# Patient Record
Sex: Female | Born: 2005 | Race: Black or African American | Hispanic: No | Marital: Single | Smoking: Never smoker
Health system: Southern US, Community
[De-identification: ages and names within clinical notes are randomized; demographics above are authoritative.]

## PROBLEM LIST (undated history)

## (undated) DIAGNOSIS — E669 Obesity, unspecified: Secondary | ICD-10-CM

## (undated) DIAGNOSIS — Z8489 Family history of other specified conditions: Secondary | ICD-10-CM

---
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
## 2024-01-08 ENCOUNTER — Emergency Department (HOSPITAL_COMMUNITY)

## 2024-01-08 ENCOUNTER — Emergency Department (HOSPITAL_COMMUNITY)
Admission: EM | Admit: 2024-01-08 | Discharge: 2024-01-08 | Discharge status: Against Medical Advice | Attending: Emergency Medicine | Admitting: Emergency Medicine

## 2024-01-08 ENCOUNTER — Encounter (HOSPITAL_COMMUNITY): Payer: Self-pay

## 2024-01-08 ENCOUNTER — Other Ambulatory Visit: Payer: Self-pay

## 2024-01-08 DIAGNOSIS — Z5321 Procedure and treatment not carried out due to patient leaving prior to being seen by health care provider: Secondary | ICD-10-CM | POA: Insufficient documentation

## 2024-01-08 DIAGNOSIS — R1031 Right lower quadrant pain: Secondary | ICD-10-CM | POA: Diagnosis present

## 2024-01-08 DIAGNOSIS — R197 Diarrhea, unspecified: Secondary | ICD-10-CM | POA: Diagnosis not present

## 2024-01-08 LAB — COMPREHENSIVE METABOLIC PANEL WITH GFR
ALT: 14 U/L (ref 0–44)
AST: 14 U/L — ABNORMAL LOW (ref 15–41)
Albumin: 3.6 g/dL (ref 3.5–5.0)
Alkaline Phosphatase: 60 U/L (ref 38–126)
Anion gap: 10 (ref 5–15)
BUN: <5 mg/dL — ABNORMAL LOW (ref 6–20)
CO2: 24 mmol/L (ref 22–32)
Calcium: 9.3 mg/dL (ref 8.9–10.3)
Chloride: 101 mmol/L (ref 98–111)
Creatinine, Ser: 0.75 mg/dL (ref 0.44–1.00)
GFR, Estimated: >60 mL/min (ref >60)
Glucose, Bld: 88 mg/dL (ref 70–99)
Potassium: 4 mmol/L (ref 3.5–5.1)
Sodium: 135 mmol/L (ref 135–145)
Total Bilirubin: 0.4 mg/dL (ref 0.0–1.2)
Total Protein: 8.1 g/dL (ref 6.5–8.1)

## 2024-01-08 LAB — URINALYSIS, ROUTINE W REFLEX MICROSCOPIC
Bilirubin Urine: NEGATIVE
Glucose, UA: NEGATIVE mg/dL
Hgb urine dipstick: NEGATIVE
Ketones, ur: 5 mg/dL — AB
Leukocytes,Ua: NEGATIVE
Nitrite: NEGATIVE
Protein, ur: 100 mg/dL — AB
Specific Gravity, Urine: 1.032 — ABNORMAL HIGH (ref 1.005–1.030)
pH: 6 (ref 5.0–8.0)

## 2024-01-08 LAB — CBC WITH DIFFERENTIAL/PLATELET
Abs Immature Granulocytes: 0.01 10*3/uL (ref 0.00–0.07)
Basophils Absolute: 0 10*3/uL (ref 0.0–0.1)
Basophils Relative: 0 %
Eosinophils Absolute: 0.2 10*3/uL (ref 0.0–0.5)
Eosinophils Relative: 2 %
HCT: 38.6 % (ref 36.0–46.0)
Hemoglobin: 12.3 g/dL (ref 12.0–15.0)
Immature Granulocytes: 0 %
Lymphocytes Relative: 44 %
Lymphs Abs: 3.8 10*3/uL (ref 0.7–4.0)
MCH: 26.7 pg (ref 26.0–34.0)
MCHC: 31.9 g/dL (ref 30.0–36.0)
MCV: 83.9 fL (ref 80.0–100.0)
Monocytes Absolute: 0.6 10*3/uL (ref 0.1–1.0)
Monocytes Relative: 7 %
Neutro Abs: 4 10*3/uL (ref 1.7–7.7)
Neutrophils Relative %: 47 %
Platelets: 449 10*3/uL — ABNORMAL HIGH (ref 150–400)
RBC: 4.6 MIL/uL (ref 3.87–5.11)
RDW: 14.3 % (ref 11.5–15.5)
WBC: 8.6 10*3/uL (ref 4.0–10.5)
nRBC: 0 % (ref 0.0–0.2)

## 2024-01-08 LAB — LIPASE, BLOOD: Lipase: 21 U/L (ref 11–51)

## 2024-01-08 LAB — HCG, SERUM, QUALITATIVE: Preg, Serum: NEGATIVE

## 2024-01-08 MED ORDER — IOHEXOL 350 MG/ML SOLN
75.0000 mL | Freq: Once | INTRAVENOUS | Status: AC | PRN
  Administered 2024-01-08: 75 mL via INTRAVENOUS

## 2024-01-08 NOTE — ED Provider Triage Note (Signed)
 Emergency Medicine Provider Triage Evaluation Note  Margaret Ware , a 18 y.o. female  was evaluated in triage.  Pt complains of right lower quadrant pain for the past week.  Mom reports that she has a history of constipation and she has had 2 bottles of magnesium citrate over the past few days and is not having diarrhea.  Mom reports that she is prone to constipation.  She reports that she has been having some vomiting and nausea but no trouble with urination or pain with urination.  No fevers.  She reports that she does have a history of ovarian cyst.  Patient reports that she does not feel any better after having bowel movements.  Review of Systems  Positive:  Negative:   Physical Exam  BP 125/84   Pulse 79   Temp 98.6 F (37 C)   Resp 16   SpO2 100%  Gen:   Awake, no distress   Resp:  Normal effort  MSK:   Moves extremities without difficulty  Other:  Patient on phone in no acute distress.  Abdomen is soft and nontender to palpation.  Medical Decision Making  Medically screening exam initiated at 7:14 PM.  Appropriate orders placed.  Bettyjane Shanikqua Zarzycki was informed that the remainder of the evaluation will be completed by another provider, this initial triage assessment does not replace that evaluation, and the importance of remaining in the ED until their evaluation is complete.  Abdomen is soft without any tenderness palpation however mom's concern for ovarian cyst or some other pathology.  Discussed that we can order CT scan with labs.   Spence Dux, [place] 01/08/24 [id number]

## 2024-01-08 NOTE — ED Triage Notes (Signed)
 Pt reports 3 days of RLQ abd pain after eating. Pt not tender to palpation. Pt also endorses diarrhea after drinking 2 bottles of mag citrate.

## 2024-01-08 NOTE — ED Notes (Signed)
 Pt decided to leave. Suggested them stay and be seen. She was adamant about leaving. [name] she would go to urgent care tomorrow.

## 2024-01-09 ENCOUNTER — Other Ambulatory Visit: Payer: Self-pay

## 2024-01-09 ENCOUNTER — Ambulatory Visit
Admission: EM | Admit: 2024-01-09 | Discharge: 2024-01-09 | Disposition: A | Discharge status: Home / Self Care | Payer: Self-pay | Attending: Family Medicine | Admitting: Family Medicine

## 2024-01-09 VITALS — BP 124/85 | HR 75 | Temp 98.0°F | Resp 16

## 2024-01-09 DIAGNOSIS — A084 Viral intestinal infection, unspecified: Secondary | ICD-10-CM | POA: Diagnosis not present

## 2024-01-09 DIAGNOSIS — N83201 Unspecified ovarian cyst, right side: Secondary | ICD-10-CM | POA: Diagnosis not present

## 2024-01-09 MED ORDER — ONDANSETRON 4 MG PO TBDP: 4.0000 mg | ORAL_TABLET | Freq: Three times a day (TID) | ORAL | 0 refills | Status: AC | PRN

## 2024-01-09 NOTE — ED Triage Notes (Addendum)
 Pt c/o RLQ abdominal pain, N/Dx3d. Pt denies vomiting. Pt states has vomited twice

## 2024-01-09 NOTE — ED Provider Notes (Signed)
 [facility]    CSN: [id number] Arrival date & time: 01/09/24  0857      History   Chief Complaint Chief Complaint  Patient presents with   Abdominal Pain    Vomiting and diarrhea - Entered by patient    HPI Margaret Ware is a 18 y.o. female presents for nausea vomiting and abdominal pain.  Patient reports 3 days of a persistent right lower quadrant nonradiating abdominal pain.  She states she has been having nonbloody nonbilious vomiting as well as diarrhea.  Denies any fevers, bloating, vaginal discharge, dysuria, body aches, URI symptoms.  No recent travel.  Denies any history of GI diagnoses such as Crohn's, IBS, colitis, diverticulitis.  Does have a history of ovarian cyst with surgical removal otherwise no other history of abdominal surgeries.  She states symptoms improved with heating pad and cannot identify any aggravating factors.  Does have a decreased appetite but has been able to stay hydrated.  Patient did go to the emergency room last night around 6 PM for same complaint.  She had blood work, UA that was negative for UTI, as well as a CT abdomen and pelvis showing multiple complex cystic lesions involving both adnexa suspicious for multiple dermoids.  No other focal abnormality noted.  Patient did leave with about being seen.  She has not taken any OTC treatments for symptoms.  No other concerns at this time.   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      Home Medications    Prior to Admission medications   Medication Sig Start Date End Date Taking? Authorizing Provider  ondansetron  (ZOFRAN -ODT) 4 MG disintegrating tablet Take 1 tablet (4 mg total) by mouth every 8 (eight) hours as needed for nausea or vomiting. 01/09/24  Yes Jaquon Gingerich, Jodi R, NP  albuterol  (VENTOLIN  HFA) 108 (90 Base) MCG/ACT inhaler Inhale 1-2 puffs into the lungs every 6 (six) hours as needed for wheezing or shortness of breath. 09/14/20   Corbin Dess, PA-C  amoxicillin  (AMOXIL ) 875 MG tablet Take 1 tablet (875 mg total) by mouth 2 (two) times daily. 04/09/20   Adolph Hoop, PA-C  bacitracin  ointment Apply 1 application topically 2 (two) times daily. 07/22/21   Reichert, Janyth Meres, MD  benzonatate  (TESSALON ) 100 MG capsule Take 1-2 capsules (100-200 mg total) by mouth 3 (three) times daily as needed. 04/09/20   Adolph Hoop, PA-C  ibuprofen  (ADVIL ) 600 MG tablet Take 1 tablet (600 mg total) by mouth every 6 (six) hours as needed for moderate pain. 02/08/19   Granville Layer, MD  oxyCODONE -acetaminophen  (PERCOCET/ROXICET) 5-325 MG tablet Take 1-2 tablets by mouth every 6 (six) hours as needed. 03/01/19   Granville Layer, MD  promethazine -dextromethorphan (PROMETHAZINE -DM) 6.25-15 MG/5ML syrup Take 5 mLs by mouth at bedtime as needed for cough. 04/09/20   Adolph Hoop, PA-C    Family History Family History  Problem Relation Age of Onset   Diabetes Maternal Grandfather    Cancer Maternal Grandfather    Hypertension Maternal Grandfather    Heart disease Maternal Grandfather    COPD Maternal Grandfather    Obesity Mother    Intellectual disability Brother    Learning disabilities Brother    Obesity Maternal Aunt    Asthma Maternal Grandmother    Early death Maternal Grandmother     Heart disease Maternal Grandmother    Obesity Maternal Grandmother    Early death Other    Heart disease Other     Social History Social History   Tobacco Use   Smoking status: Never    Passive exposure: Yes   Smokeless tobacco: Never  Vaping Use   Vaping status: Never Used  Substance Use Topics   Alcohol use: Never   Drug use: Never     Allergies   Patient has no known allergies.   Review of Systems Review of Systems  Gastrointestinal:  Positive for abdominal pain, diarrhea, nausea and vomiting.     Physical Exam Triage Vital Signs ED Triage Vitals  Encounter Vitals Group     BP 01/09/24 0908 124/85     Systolic BP Percentile --      Diastolic BP Percentile --      Pulse Rate 01/09/24 0908 75     Resp 01/09/24 0908 16     Temp 01/09/24 0908 98 F (36.7 C)     Temp Source 01/09/24 0908 Oral     SpO2 01/09/24 0908 98 %     Weight --      Height --      Head Circumference --      Peak Flow --      Pain Score 01/09/24 0906 7     Pain Loc --      Pain Education --      Exclude from Growth Chart --    No data found.  Updated Vital Signs BP 124/85   Pulse 75   Temp 98 F (36.7 C) (Oral)   Resp 16   LMP 12/18/2023   SpO2 98%   Visual Acuity Right Eye Distance:   Left Eye Distance:   Bilateral Distance:    Right Eye Near:   Left Eye Near:    Bilateral Near:     Physical Exam Vitals and nursing note reviewed.  Constitutional:      Appearance: Normal appearance. She is obese. She is not toxic-appearing or diaphoretic.  HENT:     Head: Normocephalic and atraumatic.  Eyes:     Pupils: Pupils are equal, round, and reactive to light.  Cardiovascular:     Rate and Rhythm: Normal rate.  Pulmonary:     Effort: Pulmonary effort is normal.  Abdominal:     General: Bowel sounds are normal. There is no distension.     Palpations: Abdomen is soft.     Tenderness: There is abdominal tenderness in the right lower quadrant. There is no right CVA  tenderness, left CVA tenderness, guarding or rebound. Negative signs include Rovsing's sign and McBurney's sign.  Skin:    General: Skin is warm and dry.  Neurological:     General: No focal deficit present.     Mental Status: She is alert and oriented to person, place,  and time.  Psychiatric:        Mood and Affect: Mood normal.        Behavior: Behavior normal.      UC Treatments / Results  Labs (all labs ordered are listed, but only abnormal results are displayed) Labs Reviewed - No data to display  EKG   Radiology CT ABDOMEN PELVIS W CONTRAST Result Date: 01/08/2024 CLINICAL DATA:  Right lower quadrant pain, history of known prior right dermoid excision, initial encounter EXAM: CT ABDOMEN AND PELVIS WITH CONTRAST TECHNIQUE: Multidetector CT imaging of the abdomen and pelvis was performed using the standard protocol following bolus administration of intravenous contrast. RADIATION DOSE REDUCTION: This exam was performed according to the departmental dose-optimization program which includes automated exposure control, adjustment of the mA and/or kV according to patient size and/or use of iterative reconstruction technique. CONTRAST:  75mL OMNIPAQUE  IOHEXOL  350 MG/ML SOLN COMPARISON:  02/05/2019 FINDINGS: Lower chest: No acute abnormality. Hepatobiliary: No focal liver abnormality is seen. No gallstones, gallbladder wall thickening, or biliary dilatation. Pancreas: Unremarkable. No pancreatic ductal dilatation or surrounding inflammatory changes. Spleen: Normal in size without focal abnormality. Adrenals/Urinary Tract: Adrenal glands are within normal limits. Kidneys demonstrate a normal enhancement pattern bilaterally. No renal calculi or obstructive changes are seen. The bladder is decompressed. Stomach/Bowel: No obstructive or inflammatory changes of the colon are noted. The appendix is within normal limits. Small bowel and stomach are unremarkable. Vascular/Lymphatic: No significant  vascular findings are present. No enlarged abdominal or pelvic lymph nodes. Reproductive: Uterus is within normal limits. Multiple complex lesions are identified arising from the adnexa bilaterally. Two possibly 3 small lesions are identified to the left of the midline containing fatty elements and some calcifications. 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 Associated cystic areas are identified which measure up to 4.7 cm. To the right of the midline there is a large 9.3 x 7.0 cm cystic lesion. It has some septations within as well as a complex area seen superiorly which may represent a recurrent dermoid on the right. This smaller complex area measures up to 3.5 cm. Other: No free fluid is noted. Musculoskeletal: No acute bony abnormality is noted. IMPRESSION: Previously seen ovarian dermoid on the right is no longer identified. There are however multiple complex cystic lesions involving both adnexa suspicious for multiple dermoids. Surgical consultation is recommended. No other focal abnormality is noted. Electronically Signed   By: Violeta Grey M.D.   On: 01/08/2024 21:29    Procedures Procedures (including critical care time)  Medications Ordered in UC Medications - No data to display  Initial Impression / Assessment and Plan / UC Course  I have reviewed the triage vital signs and the nursing notes.  Pertinent labs & imaging results that were available during my care of the patient were reviewed by me and considered in my medical decision making (see chart for details).     I reviewed exam and symptoms with patient.  No red flags.  VSS and pt is in NAD. Patient presenting with nausea vomiting diarrhea and right lower quadrant abdominal pain.  CT abdomen done in the ER last night showed right ovarian cyst but no other focal abnormality. Appendix normal.  Discussed with patient likely viral enteritis and symptomatic treatment.  Rx Zofran  as needed.   Discussed hydration/electrolyte replacement as well as bland diet.  Reviewed that right lower quadrant pain could be caused from her ovarian cyst and I have encouraged her to follow-up with  gynecology for further evaluation/treatment.  She states she does not have a gynecologist at this time so contact information was provided and she will contact them to make an appointment.  I did instruct her to go to the ER for any worsening symptoms that occur, red flags reviewed and patient verbalized understanding. Final Clinical Impressions(s) / UC Diagnoses   Final diagnoses:  Viral enteritis  Right ovarian cyst     Discharge Instructions      You may take Zofran  every 8 hours as needed for nausea or vomiting.  Please follow-up with gynecology as soon as possible for further evaluation of your ovarian cyst.  Focus on hydration/electrolyte replacement with Gatorade, Powerade, Pedialyte, water.  Bland diet and advance as you tolerate.  Please go to the ER if you develop any worsening symptoms.  Hope you feel better soon!  ED Prescriptions     Medication Sig Dispense Auth. Provider   ondansetron  (ZOFRAN -ODT) 4 MG disintegrating tablet Take 1 tablet (4 mg total) by mouth every 8 (eight) hours as needed for nausea or vomiting. 10 tablet Mclane Arora, Jodi R, NP      PDMP not reviewed this encounter.   Alleen Arbour, NP 01/09/24 [phone]

## 2024-01-09 NOTE — Discharge Instructions (Signed)
 You may take Zofran  every 8 hours as needed for nausea or vomiting.  Please follow-up with gynecology as soon as possible for further evaluation of your ovarian cyst.  Focus on hydration/electrolyte replacement with Gatorade, Powerade, Pedialyte, water.  Bland diet and advance as you tolerate.  Please go to the ER if you develop any worsening symptoms.  Hope you feel better soon!

## 2024-03-05 ENCOUNTER — Encounter: Admitting: Obstetrics and Gynecology

## 2024-03-05 NOTE — Progress Notes (Incomplete)
   18 y.o. No obstetric history on file. female here for ***. Single.  No LMP recorded.    She reports ***.  Urine sample provided: ***  Birth control: *** Sexually active: ***    GYN HISTORY: ***  OB History  No obstetric history on file.   Past Medical History:  Diagnosis Date   Family history of adverse reaction to anesthesia    Mother - N/V   Obesity    Past Surgical History:  Procedure Laterality Date   LAPAROTOMY Right 02/28/2019   Procedure: LAPAROTOMY AND OVARIAN CYSTECTOMY;  Surgeon: Fredirick Glenys RAMAN, MD;  Location: [facility] OR;  Service: Gynecology;  Laterality: Right;   OVARIAN CYST REMOVAL Right 02/28/2019   Procedure: OVARIAN CYSTECTOMY;  Surgeon: Fredirick Glenys RAMAN, MD;  Location: [facility] OR;  Service: Gynecology;  Laterality: Right;   Current Outpatient Medications on File Prior to Visit  Medication Sig Dispense Refill   albuterol  (VENTOLIN  HFA) 108 (90 Base) MCG/ACT inhaler Inhale 1-2 puffs into the lungs every 6 (six) hours as needed for wheezing or shortness of breath. 18 g 2   amoxicillin  (AMOXIL ) 875 MG tablet Take 1 tablet (875 mg total) by mouth 2 (two) times daily. 14 tablet 0   bacitracin  ointment Apply 1 application topically 2 (two) times daily. 120 g 0   benzonatate  (TESSALON ) 100 MG capsule Take 1-2 capsules (100-200 mg total) by mouth 3 (three) times daily as needed. 60 capsule 0   ibuprofen  (ADVIL ) 600 MG tablet Take 1 tablet (600 mg total) by mouth every 6 (six) hours as needed for moderate pain. 42 tablet 2   ondansetron  (ZOFRAN -ODT) 4 MG disintegrating tablet Take 1 tablet (4 mg total) by mouth every 8 (eight) hours as needed for nausea or vomiting. 10 tablet 0   oxyCODONE -acetaminophen  (PERCOCET/ROXICET) 5-325 MG tablet Take 1-2 tablets by mouth every 6 (six) hours as needed. 20 tablet 0   promethazine -dextromethorphan (PROMETHAZINE -DM) 6.25-15 MG/5ML syrup Take 5 mLs by mouth at bedtime as needed for cough. 100 mL 0   No current facility-administered  medications on file prior to visit.   No Known Allergies    PE There were no vitals filed for this visit. There is no height or weight on file to calculate BMI.  Physical Exam    Assessment and Plan:        There are no diagnoses linked to this encounter.  Clotilda FORBES Pa, CMA
